# Patient Record
Sex: Male | Born: 1980 | Race: White | Hispanic: No | Marital: Single | Smoking: Former smoker
Health system: Southern US, Community
[De-identification: ages and names within clinical notes are randomized; demographics above are authoritative.]

## PROBLEM LIST (undated history)

## (undated) DIAGNOSIS — F419 Anxiety disorder, unspecified: Secondary | ICD-10-CM

## (undated) HISTORY — PX: TONSILLECTOMY: SUR1361

## (undated) HISTORY — PX: APPENDECTOMY: SHX54

## (undated) HISTORY — PX: FRACTURE SURGERY: SHX138

---

## 2003-12-15 ENCOUNTER — Other Ambulatory Visit: Payer: Self-pay

## 2004-06-06 ENCOUNTER — Emergency Department: Payer: Self-pay | Admitting: Emergency Medicine

## 2004-09-25 ENCOUNTER — Emergency Department: Payer: Self-pay | Admitting: Internal Medicine

## 2005-07-23 ENCOUNTER — Emergency Department: Payer: Self-pay | Admitting: Emergency Medicine

## 2005-09-06 ENCOUNTER — Emergency Department: Payer: Self-pay | Admitting: Emergency Medicine

## 2006-04-29 ENCOUNTER — Emergency Department: Payer: Self-pay | Admitting: Emergency Medicine

## 2007-08-04 ENCOUNTER — Emergency Department: Payer: Self-pay | Admitting: Emergency Medicine

## 2008-03-01 ENCOUNTER — Inpatient Hospital Stay: Payer: Self-pay | Admitting: Specialist

## 2008-11-25 ENCOUNTER — Emergency Department: Payer: Self-pay | Admitting: Emergency Medicine

## 2009-04-05 ENCOUNTER — Emergency Department: Payer: Self-pay | Admitting: Emergency Medicine

## 2009-12-28 ENCOUNTER — Emergency Department: Payer: Self-pay | Admitting: Emergency Medicine

## 2010-04-19 ENCOUNTER — Ambulatory Visit: Payer: Self-pay | Admitting: Internal Medicine

## 2010-04-25 ENCOUNTER — Emergency Department: Payer: Self-pay | Admitting: Unknown Physician Specialty

## 2011-05-02 ENCOUNTER — Emergency Department: Payer: Self-pay | Admitting: Unknown Physician Specialty

## 2011-11-30 ENCOUNTER — Emergency Department: Payer: Self-pay | Admitting: *Deleted

## 2012-04-09 ENCOUNTER — Emergency Department: Payer: Self-pay | Admitting: *Deleted

## 2012-11-19 ENCOUNTER — Emergency Department: Payer: Self-pay | Admitting: Emergency Medicine

## 2013-04-10 IMAGING — CR DG TIBIA/FIBULA 2V*L*
1 series · 2 of 2 positions shown · non-contrast
Comparison: None

REASON FOR EXAM: swelling, bruising
COMMENTS:

PROCEDURE:     DXR - DXR TIBIA AND FIBULA LT (LOWER L  - April 09, 2012  [DATE]
RESULT:     History: Pain

[Series 1: x tib-fib ap left · 0.14mm/px · 2 of 2 slices shown]
[im 1/2]
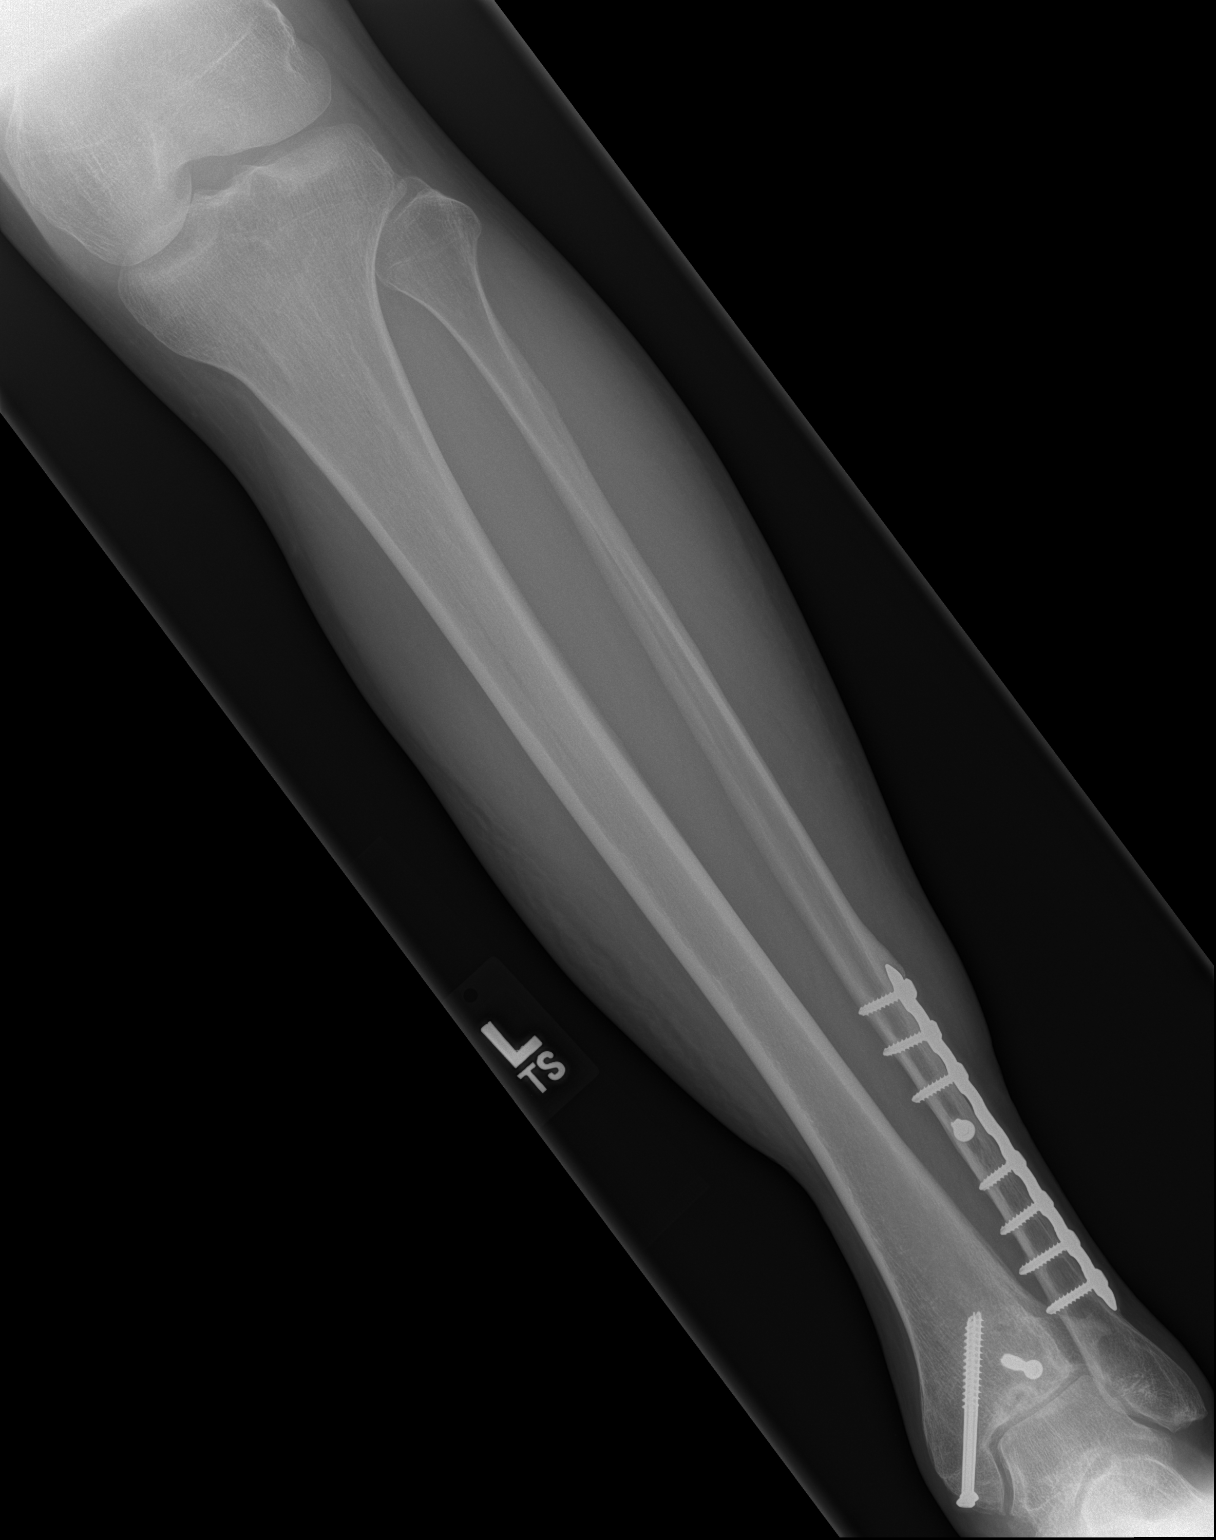
[im 2/2]
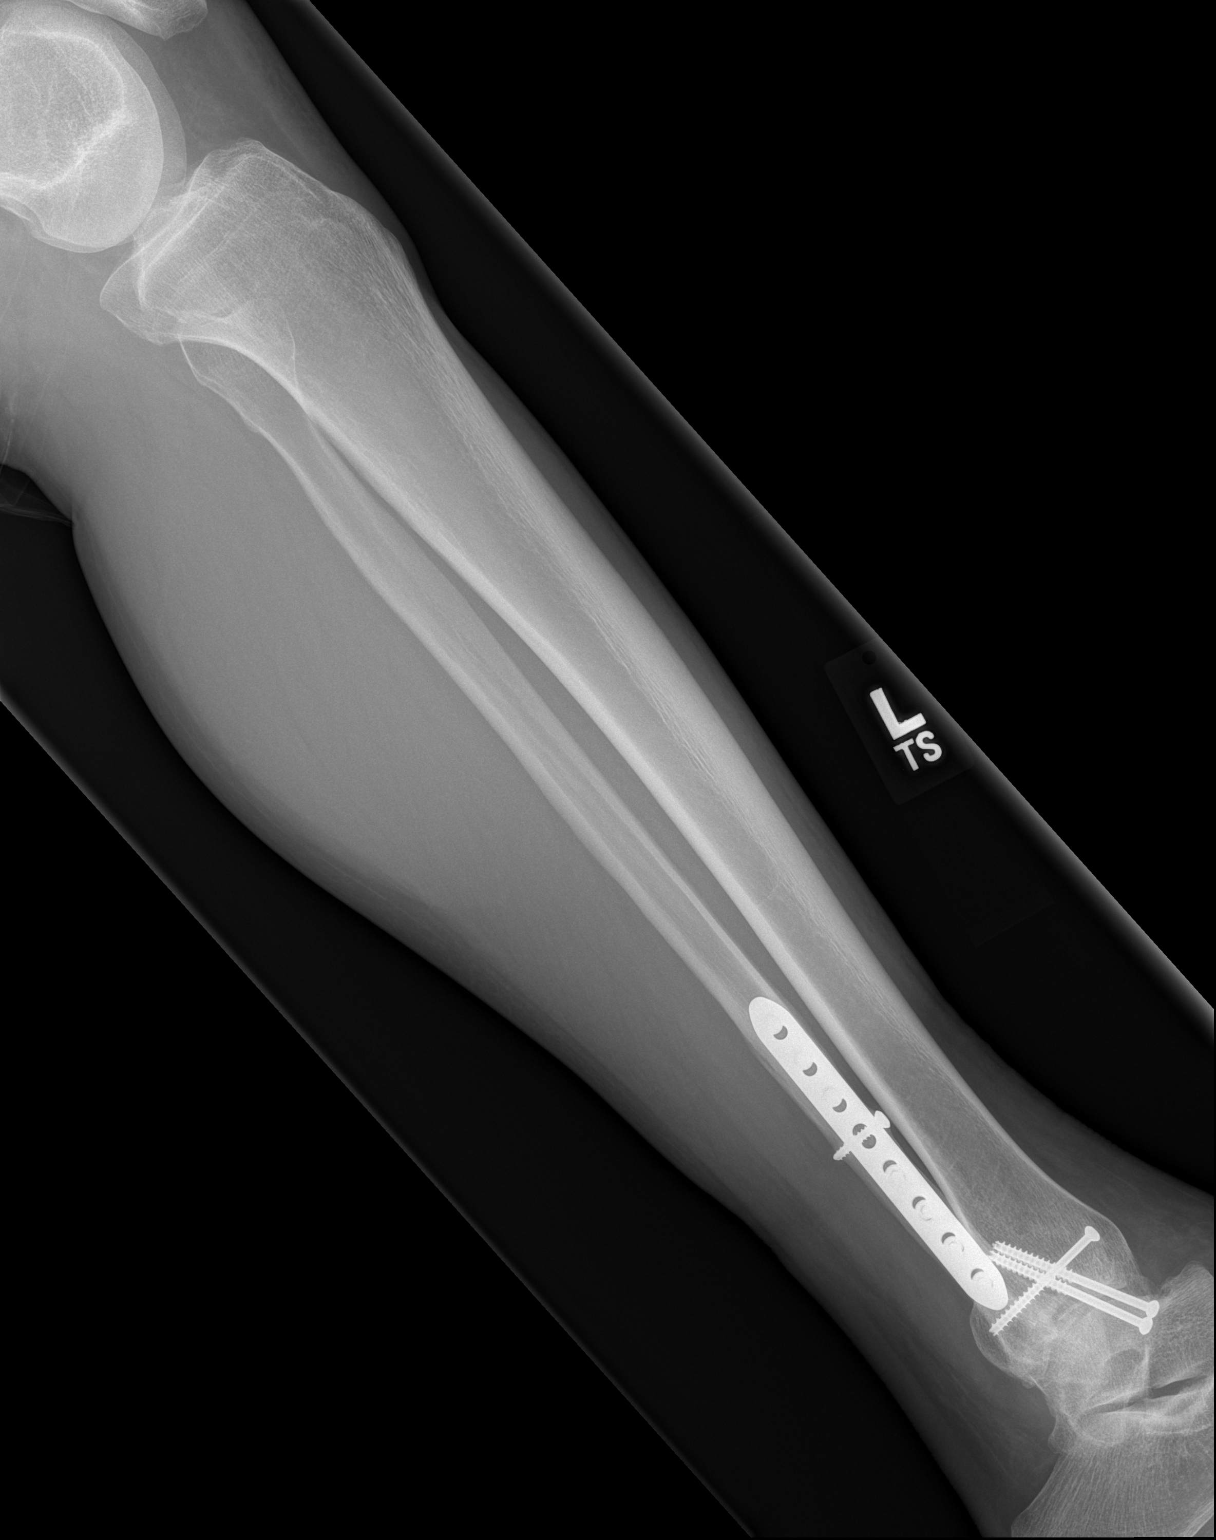

[2 of 2 positions shown; findings below may reference images not displayed]

FINDINGS: AP and lateral views of the  left  tibia and fibula demonstrates no acute
fracture or dislocation. There is orthopedic hardware transfixing a healed
distal fibular fracture and medial malleolus fracture. There is cystic
change involving the articular surface of the tibial plafond concerning for
osteochondral lesion. The soft tissues are unremarkable.
IMPRESSION: Please see above.

[REDACTED]

## 2014-06-17 ENCOUNTER — Emergency Department: Payer: Self-pay | Admitting: Emergency Medicine

## 2014-06-17 LAB — CBC WITH DIFFERENTIAL/PLATELET
Basophil #: 0 10*3/uL (ref 0.0–0.1)
Basophil %: 0.3 %
EOS ABS: 0.2 10*3/uL (ref 0.0–0.7)
EOS PCT: 1.3 %
HCT: 46.2 % (ref 40.0–52.0)
HGB: 15.4 g/dL (ref 13.0–18.0)
Lymphocyte #: 4.1 10*3/uL — ABNORMAL HIGH (ref 1.0–3.6)
Lymphocyte %: 31.4 %
MCH: 32.2 pg (ref 26.0–34.0)
MCHC: 33.4 g/dL (ref 32.0–36.0)
MCV: 96 fL (ref 80–100)
MONO ABS: 1.1 x10 3/mm — AB (ref 0.2–1.0)
Monocyte %: 8.6 %
Neutrophil #: 7.5 10*3/uL — ABNORMAL HIGH (ref 1.4–6.5)
Neutrophil %: 58.4 %
PLATELETS: 362 10*3/uL (ref 150–440)
RBC: 4.79 10*6/uL (ref 4.40–5.90)
RDW: 13.4 % (ref 11.5–14.5)
WBC: 12.9 10*3/uL — AB (ref 3.8–10.6)

## 2014-06-17 LAB — BASIC METABOLIC PANEL
Anion Gap: 12 (ref 7–16)
BUN: 11 mg/dL (ref 7–18)
CO2: 22 mmol/L (ref 21–32)
Calcium, Total: 9 mg/dL (ref 8.5–10.1)
Chloride: 104 mmol/L (ref 98–107)
Creatinine: 1 mg/dL (ref 0.60–1.30)
Glucose: 122 mg/dL — ABNORMAL HIGH (ref 65–99)
Osmolality: 276 (ref 275–301)
Potassium: 3.7 mmol/L (ref 3.5–5.1)
Sodium: 138 mmol/L (ref 136–145)

## 2014-11-25 ENCOUNTER — Emergency Department
Admission: EM | Admit: 2014-11-25 | Discharge: 2014-11-25 | Disposition: A | Discharge status: Home / Self Care | Payer: Self-pay | Attending: Emergency Medicine | Admitting: Emergency Medicine

## 2014-11-25 DIAGNOSIS — T63441A Toxic effect of venom of bees, accidental (unintentional), initial encounter: Secondary | ICD-10-CM | POA: Insufficient documentation

## 2014-11-25 DIAGNOSIS — Z720 Tobacco use: Secondary | ICD-10-CM | POA: Insufficient documentation

## 2014-11-25 DIAGNOSIS — Y9289 Other specified places as the place of occurrence of the external cause: Secondary | ICD-10-CM | POA: Insufficient documentation

## 2014-11-25 DIAGNOSIS — Y9389 Activity, other specified: Secondary | ICD-10-CM | POA: Insufficient documentation

## 2014-11-25 DIAGNOSIS — Y998 Other external cause status: Secondary | ICD-10-CM | POA: Insufficient documentation

## 2014-11-25 MED ORDER — DIPHENHYDRAMINE HCL 50 MG/ML IJ SOLN
INTRAMUSCULAR | Status: DC
Start: 2014-11-25 — End: 2014-11-25
  Filled 2014-11-25: qty 1

## 2014-11-25 MED ORDER — DIPHENHYDRAMINE HCL 50 MG/ML IJ SOLN
25.0000 mg | Freq: Once | INTRAMUSCULAR | Status: AC
  Administered 2014-11-25: 25 mg via INTRAVENOUS

## 2014-11-25 MED ORDER — EPINEPHRINE 0.3 MG/0.3ML IJ SOAJ: 0.3000 mg | Freq: Once | INTRAMUSCULAR | Status: DC

## 2014-11-25 MED ORDER — METHYLPREDNISOLONE SODIUM SUCC 125 MG IJ SOLR
125.0000 mg | Freq: Once | INTRAMUSCULAR | Status: AC
  Administered 2014-11-25: 125 mg via INTRAVENOUS

## 2014-11-25 MED ORDER — METHYLPREDNISOLONE SODIUM SUCC 125 MG IJ SOLR
INTRAMUSCULAR | Status: AC
  Filled 2014-11-25: qty 2

## 2014-11-25 NOTE — ED Provider Notes (Signed)
[facility] Emergency Department Provider Note     Time seen: ----------------------------------------- 11:12 AM on 11/25/2014 -----------------------------------------    I have reviewed the triage vital signs and the nursing notes.   HISTORY  Chief Complaint Allergic Reaction    HPI Jesus RibasJoshua L Nisley is a 34 y.o. male who presents ER after he was stung by a bee approximate 1520 prior to arrival. Patient states himself home EpiPen in the left thigh. Patient was really concerned due to prior severe allergic reaction for which she received epinephrine by EMS. This was with a wasp sting to the back is had. Denies any difficulty breathing or swallowing, denies shortness of breath or rashes this time.     History reviewed. No pertinent past medical history.  There are no active problems to display for this patient.   Past Surgical History  Procedure Laterality Date  . Tonsillectomy      No current outpatient prescriptions on file.  Allergies Bee venom  No family history on file.  Social History History  Substance Use Topics  . Smoking status: Current Every Day Smoker -- 1.00 packs/day    Types: Cigarettes  . Smokeless tobacco: Not on file  . Alcohol Use: Yes    Review of Systems Constitutional: Negative for fever. Eyes: Negative for visual changes. ENT: Negative for sore throat. Cardiovascular: Negative for chest pain. Respiratory: Negative for shortness of breath. Gastrointestinal: Negative for abdominal pain, vomiting and diarrhea. Genitourinary: Negative for dysuria. Musculoskeletal: Negative for back pain. Skin: Negative for rash. Neurological: Negative for headaches, focal weakness or numbness.  10-point ROS otherwise negative.  ____________________________________________   PHYSICAL EXAM:  VITAL SIGNS: ED Triage Vitals  Enc Vitals Group     BP 11/25/14 1036 144/84 mmHg     Pulse Rate 11/25/14 1036 100     Resp 11/25/14  1036 18     Temp 11/25/14 1036 98.2 F (36.8 C)     Temp Source 11/25/14 1036 Oral     SpO2 11/25/14 1036 97 %     Weight 11/25/14 1036 185 lb (83.915 kg)     Height 11/25/14 1036 5\' 11"  (1.803 m)     Head Cir --      Peak Flow --      Pain Score --      Pain Loc --      Pain Edu? --      Excl. in GC? --     Constitutional: Alert and oriented. Well appearing and in no distress. Eyes: Conjunctivae are normal. PERRL. Normal extraocular movements. ENT   Head: Normocephalic and atraumatic.   Nose: No congestion/rhinnorhea.   Mouth/Throat: Mucous membranes are moist.   Neck: No stridor. Hematological/Lymphatic/Immunilogical: No cervical lymphadenopathy. Cardiovascular: Rapid rate, regular rhythm. Normal and symmetric distal pulses are present in all extremities. No murmurs, rubs, or gallops. Respiratory: Normal respiratory effort without tachypnea nor retractions. Breath sounds are clear and equal bilaterally. No wheezes/rales/rhonchi. Gastrointestinal: Soft and nontender. No distention. No abdominal bruits. There is no CVA tenderness. Musculoskeletal: Nontender with normal range of motion in all extremities. No joint effusions.  No lower extremity tenderness nor edema. Neurologic:  Normal speech and language. No gross focal neurologic deficits are appreciated. Speech is normal. No gait instability. Skin:  Skin is warm, dry and intact. No rash noted. No hives are noted Psychiatric: Mood and affect are normal. Speech and behavior are normal. Patient exhibits appropriate insight and judgment.  ____________________________________________   ___________________________________________  ED COURSE:  Pertinent  labs & imaging results that were available during my care of the patient were reviewed by me and considered in my medical decision making (see chart for details). Patient will be observed after receiving Benadryl and Solu-Medrol here, he is in no acute distress this time  will likely be discharged shortly  ____________________________________________   RADIOLOGY  None  ____________________________________________    FINAL ASSESSMENT AND PLAN  Bee sting, allergic reaction  Plan: Patient is in no acute distress, has not had any further sequela from either bee sting or epinephrine injection. Stable for outpatient follow-up.    Emily Filbert, MD   Emily Filbert, MD 11/25/14 1226

## 2014-11-25 NOTE — Discharge Instructions (Signed)

## 2014-11-25 NOTE — ED Notes (Signed)
Pt states he was stung on the left FA about 15-6020min pTA, states he is allergic to them and used his epi pen.the patient is not having any difficulty at present.

## 2015-04-03 ENCOUNTER — Encounter: Payer: Self-pay | Admitting: Emergency Medicine

## 2015-04-03 ENCOUNTER — Ambulatory Visit
Admission: EM | Admit: 2015-04-03 | Discharge: 2015-04-03 | Disposition: A | Discharge status: Home / Self Care | Payer: BLUE CROSS/BLUE SHIELD | Attending: Family Medicine | Admitting: Family Medicine

## 2015-04-03 DIAGNOSIS — Z202 Contact with and (suspected) exposure to infections with a predominantly sexual mode of transmission: Secondary | ICD-10-CM

## 2015-04-03 LAB — CHLAMYDIA/NGC RT PCR (ARMC ONLY)
CHLAMYDIA TR: NOT DETECTED
N GONORRHOEAE: NOT DETECTED

## 2015-04-03 MED ORDER — AZITHROMYCIN 500 MG PO TABS
1000.0000 mg | ORAL_TABLET | Freq: Once | ORAL | Status: AC
  Administered 2015-04-03: 1000 mg via ORAL

## 2015-04-03 MED ORDER — CEFTRIAXONE SODIUM 250 MG IJ SOLR
250.0000 mg | Freq: Once | INTRAMUSCULAR | Status: AC
  Administered 2015-04-03: 250 mg via INTRAMUSCULAR

## 2015-04-03 MED ORDER — AZITHROMYCIN 500 MG PO TABS: 1000.0000 mg | ORAL_TABLET | Freq: Once | ORAL | Status: DC

## 2015-04-03 NOTE — ED Notes (Signed)
Girlfriend was diagnosed with chlamydia. Pt denies symptoms. Pt comes to be treated.

## 2015-04-03 NOTE — Discharge Instructions (Signed)
Chlamydia, Male Chlamydia is an infection. It is spread through sexual contact. Chlamydia can be in different areas of the body. These areas include the urethra, throat, or rectum. It is important to treat chlamydia as soon as possible. It can damage other organs.  CAUSES  Chlamydia is caused by bacteria. It is a sexually transmitted disease. This means that it is passed from an infected partner during intimate contact. This contact could be with the genitals, mouth, or rectal area.  SIGNS AND SYMPTOMS  There may not be any symptoms. This is often the case early in the infection. If there are symptoms, they are usually mild and may only be noticeable in the morning. Symptoms you may notice include:   Burning with urination.  Pain or swelling in the testicles.  Watery mucus-like discharge from the penis.  Long-standing (chronic) pelvic pain after frequent infections.  Pain, swelling, or itching around the anus.  A sore throat.  Itching, burning, or redness in the eyes, or discharge from the eyes. DIAGNOSIS  To diagnose this infection, your health care provider will do a pelvic exam. A sample of urine or a swab from the rectum may be taken for testing.  TREATMENT  Chlamydia is treated with antibiotic medicines. Your health care provider may test you for infection again 3 months after treatment. HOME CARE INSTRUCTIONS  Take your antibiotic medicine as directed by your health care provider. Finish the antibiotic even if you start to feel better. Incomplete treatment will put you at risk for not being able to have children (sterility).   Take medicines only as directed by your health care provider.   Rest.   Inform any sexual partners about your infection. Even if they are symptom free or have a negative culture or evaluation, they should be treated for the condition.   Do not have sex (intercourse) until treatment is completed and your health care provider says it is okay.   Keep  all follow-up visits as directed by your health care provider.   Not all test results are available during your visit. If your test results are not back during the visit, make an appointment with your health care provider to find out the results. Do not assume everything is normal if you have not heard from your health care provider or the medical facility. It is your responsibility to get your test results. SEEK MEDICAL CARE IF:  You develop new joint pain.  You have a fever. SEEK IMMEDIATE MEDICAL CARE IF:   Your pain increases.   You have abnormal discharge.   You have pain during intercourse. MAKE SURE YOU:   Understand these instructions.  Will watch your condition.  Will get help right away if you are not doing well or get worse.   This information is not intended to replace advice given to you by your health care provider. Make sure you discuss any questions you have with your health care provider.   Document Released: 06/12/2005 Document Revised: 07/03/2014 Document Reviewed: 12/19/2012 Elsevier Interactive Patient Education 2016 [employer] Chlamydia Test WHY AM I HAVING THIS TEST? This a test to see if you have chlamydia. Chlamydia is a common sexually transmitted disease (STD). Your health care provider may perform this test if you:  Are sexually active.  Have another STD.  Have complaints about pelvic pain, vaginal discharge, or both. WHAT KIND OF SAMPLE IS TAKEN? Depending on your symptoms, your health care provider may collect any one of the following samples:  A blood sample. This is usually collected by inserting a needle into a vein.  A tissue sample. This is collected by swabbing tissue of the eye, urethra, or cervix.  A sample of sputum. This is collected by having you cough into a sterile container that is provided by the lab. HOW DO I PREPARE FOR THE TEST? There is no preparation required for this test. HOW ARE THE TEST RESULTS REPORTED? Your  test results will be reported as either positive or negative. It is your responsibility to obtain your test results. Ask the lab or department performing the test when and how you will get your results. WHAT DO THE RESULTS MEAN? A positive result means that you have a chlamydia infection. Talk with your health care provider to discuss your results, treatment options, and if necessary, the need for more tests. Talk with your health care provider if you have any questions about your results.   This information is not intended to replace advice given to you by your health care provider. Make sure you discuss any questions you have with your health care provider.   Document Released: 07/05/2004 Document Revised: 07/03/2014 Document Reviewed: 11/05/2013 Elsevier Interactive Patient Education [employer]

## 2015-04-04 NOTE — ED Provider Notes (Addendum)
CSN: [id number]     Arrival date & time 04/03/15  1346 History   First MD Initiated Contact with Patient 04/03/15 1406     Chief Complaint  Patient presents with  . Exposure to STD   (Consider location/radiation/quality/duration/timing/severity/associated sxs/prior Treatment) HPI Comments: Single caucasian male here for chlamydia treatment as girlfriend was told she had + test this week and started treatment today.  PSHx appendectomy, tonsillectomy  PMHx anaphylactic reaction bee stings ambulance to ER, smoker and alcohol intake +  Prior history of positive STD a couple years ago.  Denied symptoms at this time.  Patient is a 34 y.o. male presenting with STD exposure. The history is provided by the patient.  Exposure to STD This is a recurrent problem. The current episode started yesterday. The problem has not changed since onset.Pertinent negatives include no chest pain, no abdominal pain, no headaches and no shortness of breath. Nothing aggravates the symptoms. He has tried nothing for the symptoms.    History reviewed. No pertinent past medical history. Past Surgical History  Procedure Laterality Date  . Tonsillectomy    . Appendectomy     History reviewed. No pertinent family history. Social History  Substance Use Topics  . Smoking status: Current Every Day Smoker -- 1.00 packs/day    Types: Cigarettes  . Smokeless tobacco: None  . Alcohol Use: Yes    Review of Systems  Constitutional: Negative for fever, chills, diaphoresis, activity change, appetite change, fatigue and unexpected weight change.  HENT: Negative for congestion, dental problem, drooling, ear discharge, ear pain, facial swelling, hearing loss, mouth sores, nosebleeds, postnasal drip, rhinorrhea, sinus pressure, sneezing, sore throat, tinnitus, trouble swallowing and voice change.   Eyes: Negative for photophobia, pain, discharge, redness, itching and visual disturbance.  Respiratory: Negative for cough, choking,  chest tightness, shortness of breath, wheezing and stridor.   Cardiovascular: Negative for chest pain and leg swelling.  Gastrointestinal: Negative for nausea, vomiting, abdominal pain, diarrhea, constipation, blood in stool and abdominal distention.  Endocrine: Negative for cold intolerance and heat intolerance.  Genitourinary: Negative for dysuria, urgency, frequency, hematuria, flank pain, decreased urine volume, discharge, penile swelling, scrotal swelling, enuresis, genital sores, penile pain and testicular pain.  Musculoskeletal: Negative for myalgias, back pain, joint swelling, arthralgias, gait problem, neck pain and neck stiffness.  Skin: Negative for color change, pallor, rash and wound.  Allergic/Immunologic: Positive for environmental allergies. Negative for food allergies.  Neurological: Negative for dizziness, tremors, seizures, syncope, facial asymmetry, speech difficulty, weakness, light-headedness, numbness and headaches.  Hematological: Negative for adenopathy. Does not bruise/bleed easily.  Psychiatric/Behavioral: Negative for behavioral problems, confusion, sleep disturbance and agitation.    Allergies  Bee venom  Home Medications   Prior to Admission medications   Medication Sig Start Date End Date Taking? Authorizing Provider  EPINEPHrine (EPIPEN 2-PAK) 0.3 mg/0.3 mL IJ SOAJ injection Inject 0.3 mLs (0.3 mg total) into the muscle once. 11/25/14   Emily Filbert, MD  ibuprofen (ADVIL,MOTRIN) 200 MG tablet Take 600-800 mg by mouth every 6 (six) hours as needed.    Historical Provider, MD   Meds Ordered and Administered this Visit   Medications  cefTRIAXone (ROCEPHIN) injection 250 mg (250 mg Intramuscular Given 04/03/15 1430)  azithromycin (ZITHROMAX) tablet 1,000 mg (1,000 mg Oral Given 04/03/15 1431)    BP 133/74 mmHg  Pulse 64  Temp(Src) 98.3 F (36.8 C) (Oral)  Resp 16  Ht  (1.803 m)  Wt 185 lb (83.915 kg)  BMI 25.81 kg/m2  SpO2 100% No data  found.   Physical Exam  Constitutional: He is oriented to person, place, and time. Vital signs are normal. He appears well-developed and well-nourished. No distress.  HENT:  Head: Normocephalic and atraumatic.  Right Ear: External ear normal.  Left Ear: External ear normal.  Nose: Nose normal.  Mouth/Throat: Oropharynx is clear and moist. No oropharyngeal exudate.  Eyes: Conjunctivae, EOM and lids are normal. Pupils are equal, round, and reactive to light. Right eye exhibits no discharge. Left eye exhibits no discharge. No scleral icterus.  Neck: Trachea normal and normal range of motion. Neck supple. No tracheal deviation present.  Cardiovascular: Normal rate, regular rhythm, normal heart sounds and intact distal pulses.   Pulmonary/Chest: Effort normal and breath sounds normal. No stridor. No respiratory distress. He has no wheezes. He has no rales.  Abdominal: Soft. He exhibits no distension.  Musculoskeletal: Normal range of motion. He exhibits no edema.  Neurological: He is alert and oriented to person, place, and time. He exhibits normal muscle tone. Coordination normal.  Skin: Skin is warm, dry and intact. No rash noted. He is not diaphoretic. No erythema. No pallor.  Psychiatric: He has a normal mood and affect. His speech is normal and behavior is normal. Judgment and thought content normal. Cognition and memory are normal.  Nursing note and vitals reviewed.   ED Course  Procedures (including critical care time)  Labs Review Labs Reviewed  CHLAMYDIA/NGC RT PCR [facility] ONLY)    Imaging Review No results found.   RN Lia Foyer administered rocephin  IM x 1 and zithromax  po x 1.  Repeat VSS.  Patient discharged ambulatory after 15 minute observation will call with test results once available.  Patient verbalized understanding of information/instructions, agreed with plan of care and had no further questions at this time.   MDM   1. Exposure to  chlamydia    did not use condom consistently with girlfriend she was notified she had chlamydia and started treatment today.  Patient unsure what other testing she had done.  Discussed if one test positive typically another is positive.  Patient only wanted gonorrhea and chlamydia testing completed. Notified patients results will be available in 3-4 days will call for results.  Treating for chlamydia and gonorrhea today per patient preference and known contact this week with chlamydia positive partner.  RTC if new symptoms.  Encouraged patient to use condoms for all sexual encounters since he is not in a monogamous relationship.  Discussed not all sexually transmitted diseases are curable e.g. herpes, HPV, HIV and that antibiotic resistant gonorrhea and syphilis in [place]. [facility] handout on gonorrhea and chlamydia testing and chlamydia information given to patient.  Patient verbalized understanding and agreed with plan of care and had no further questions at this time.      Barbaraann Barthel, NP 04/04/15 1108  15 Apr 2015 at 0825 Attempted to notify patient via telephone lab tests normal/negative.  Letter sent to address on file as no answer and voicemail unavailable.  GC negative.  Barbaraann Barthel, NP 04/15/15 [phone]

## 2015-04-05 NOTE — ED Notes (Signed)
Final report STD labs negative

## 2017-03-20 ENCOUNTER — Ambulatory Visit
Admission: EM | Admit: 2017-03-20 | Discharge: 2017-03-20 | Disposition: A | Discharge status: Home / Self Care | Payer: 59 | Attending: Emergency Medicine | Admitting: Emergency Medicine

## 2017-03-20 ENCOUNTER — Encounter: Payer: Self-pay | Admitting: Emergency Medicine

## 2017-03-20 ENCOUNTER — Ambulatory Visit (INDEPENDENT_AMBULATORY_CARE_PROVIDER_SITE_OTHER): Payer: 59

## 2017-03-20 DIAGNOSIS — R062 Wheezing: Secondary | ICD-10-CM | POA: Diagnosis not present

## 2017-03-20 DIAGNOSIS — R6889 Other general symptoms and signs: Secondary | ICD-10-CM

## 2017-03-20 DIAGNOSIS — J069 Acute upper respiratory infection, unspecified: Secondary | ICD-10-CM

## 2017-03-20 DIAGNOSIS — R05 Cough: Secondary | ICD-10-CM

## 2017-03-20 LAB — RAPID INFLUENZA A&B ANTIGENS (ARMC ONLY)
INFLUENZA A (ARMC): NEGATIVE
INFLUENZA B (ARMC): NEGATIVE

## 2017-03-20 LAB — RAPID STREP SCREEN (MED CTR MEBANE ONLY): STREPTOCOCCUS, GROUP A SCREEN (DIRECT): NEGATIVE

## 2017-03-20 MED ORDER — AEROCHAMBER PLUS MISC: 2 refills | Status: DC

## 2017-03-20 MED ORDER — IBUPROFEN 600 MG PO TABS: 600.0000 mg | ORAL_TABLET | Freq: Four times a day (QID) | ORAL | 0 refills | Status: DC | PRN

## 2017-03-20 MED ORDER — ALBUTEROL SULFATE HFA 108 (90 BASE) MCG/ACT IN AERS: 1.0000 | INHALATION_SPRAY | Freq: Four times a day (QID) | RESPIRATORY_TRACT | 0 refills | Status: DC | PRN

## 2017-03-20 MED ORDER — PREDNISONE 20 MG PO TABS: 40.0000 mg | ORAL_TABLET | Freq: Every day | ORAL | 0 refills | Status: AC

## 2017-03-20 MED ORDER — IPRATROPIUM-ALBUTEROL 0.5-2.5 (3) MG/3ML IN SOLN
3.0000 mL | Freq: Once | RESPIRATORY_TRACT | Status: AC
  Administered 2017-03-20: 3 mL via RESPIRATORY_TRACT

## 2017-03-20 MED ORDER — IPRATROPIUM BROMIDE 0.06 % NA SOLN: 2.0000 | Freq: Four times a day (QID) | NASAL | 0 refills | Status: DC

## 2017-03-20 NOTE — ED Triage Notes (Signed)
Patient here today c/o generalized body aches, headache, sore throat x 2 days. Patient has felt feverish, but did not take temperature. Patient has tried OTC Catering manager, Dayquil, Nyquil with a little bit of relief.

## 2017-03-20 NOTE — ED Provider Notes (Signed)
HPI  SUBJECTIVE:  Jesus Nash is a 36 y.o. male who presents with body aches, diffuse headache, feverish, nasal congestion, rhinorrhea, left ear pain, sore throat, cough productive of whitish phlegm, wheezing, shortness of breath starting yesterday. No documented fevers at home. He has tried Alka-Seltzer, NyQuil, DayQuil with some improvement in symptoms. No aggravating factors. Patient denies cervical lymphadenopathy, sensation of his throat swelling shut, difficulty breathing, drooling, trismus, muffled hot potato voice. No sinus pain or pressure, change in hearing, otorrhea. No postnasal drip. No chest pain. Denies neck stiffness, rash, abdominal pain. The cough is not keeping him up at night. No antibiotics in the past month. No allergy type symptoms . No tick bite. He took Alka-Seltzer within 6-8 hours of evaluation. He has a 25-pack-year history of smoking, L otitis media, left TM perforation, status post tonsillectomy. No history of asthma, emphysema, COPD, diabetes, hypertension. PMD: None.  History reviewed. No pertinent past medical history.  Past Surgical History:  Procedure Laterality Date  . APPENDECTOMY    . TONSILLECTOMY      Family History  Problem Relation Age of Onset  . Diabetes Mother     Social History  Substance Use Topics  . Smoking status: Current Every Day Smoker    Packs/day: 1.00    Types: Cigarettes  . Smokeless tobacco: Never Used  . Alcohol use Yes    No current facility-administered medications for this encounter.   Current Outpatient Prescriptions:  .  albuterol (PROVENTIL HFA;VENTOLIN HFA) 108 (90 Base) MCG/ACT inhaler, Inhale 1-2 puffs into the lungs every 6 (six) hours as needed for wheezing or shortness of breath., Disp: 1 Inhaler, Rfl: 0 .  EPINEPHrine (EPIPEN 2-PAK) 0.3 mg/0.3 mL IJ SOAJ injection, Inject 0.3 mLs (0.3 mg total) into the muscle once., Disp: 1 Device, Rfl: 1 .  ibuprofen (ADVIL,MOTRIN) 600 MG tablet, Take 1 tablet (600 mg total)  by mouth every 6 (six) hours as needed., Disp: 30 tablet, Rfl: 0 .  ipratropium (ATROVENT) 0.06 % nasal spray, Place 2 sprays into both nostrils 4 (four) times daily. 3-4 times/ day, Disp: 15 mL, Rfl: 0 .  predniSONE (DELTASONE) 20 MG tablet, Take 2 tablets (40 mg total) by mouth daily with breakfast., Disp: 10 tablet, Rfl: 0 .  Spacer/Aero-Holding Chambers (AEROCHAMBER PLUS) inhaler, Use as instructed, Disp: 1 each, Rfl: 2  Allergies  Allergen Reactions  . Bee Venom Anaphylaxis and Hives     ROS  As noted in HPI.   Physical Exam  BP 128/69 (BP Location: Left Arm)   Pulse 74   Temp 98.2 F (36.8 C) (Oral)   Resp 16   Ht  (1.803 m)   Wt 185 lb (83.9 kg)   SpO2 98%   BMI 25.80 kg/m   Constitutional: Well developed, well nourished, no acute distress Eyes:  EOMI, conjunctiva normal bilaterally HENT: Normocephalic, atraumatic,mucus membranes moist . Left TM with scarring. It is not dull or bulging. No effusion.. Right TM normal. Positive extensive watery purulent nasal drainage and erythematous, swollen turbinates. No sinus tenderness. Tonsils surgically absent. Uvula midline. Difficulty visualizing oropharynx. Neck: No cervical lymphadenopathy, meningismus Respiratory: Normal inspiratory effort, fair air movement, loud expiratory wheezing throughout and prolonged expiratory phase  Cardiovascular: Normal rate regular rhythm no murmurs rubs or gallops  GI: nondistended soft, nontender, active bowel sounds. No guarding, rebound.  Back: No CVA tenderness skin: No rash, skin intact Musculoskeletal: no deformities Neurologic: Alert & oriented x 3, no focal neuro deficits Psychiatric: Speech and behavior  appropriate   ED Course   Medications  ipratropium-albuterol (DUONEB) 0.5-2.5 (3) MG/3ML nebulizer solution 3 mL (3 mLs Nebulization Given 03/20/17 1442)    Orders Placed This Encounter  Procedures  . Rapid strep screen    Standing Status:   Standing    Number of  Occurrences:   1  . Rapid Influenza A&B Antigens ([facility] only)    Standing Status:   Standing    Number of Occurrences:   1  . Culture, group A strep    Standing Status:   Standing    Number of Occurrences:   1  . DG Chest 2 View    Standing Status:   Standing    Number of Occurrences:   1    Order Specific Question:   Reason for Exam (SYMPTOM  OR DIAGNOSIS REQUIRED)    Answer:   BA cough wheezing throughout r/o pna ptx effusion pulm edema  . Droplet precaution    Standing Status:   Standing    Number of Occurrences:   1    Results for orders placed or performed during the hospital encounter of 03/20/17 (from the past 24 hour(s))  Rapid strep screen     Status: None   Collection Time: 03/20/17  2:27 PM  Result Value Ref Range   Streptococcus, Group A Screen (Direct) NEGATIVE NEGATIVE  Rapid Influenza A&B Antigens ([facility] only)     Status: None   Collection Time: 03/20/17  2:27 PM  Result Value Ref Range   Influenza A ([facility]) NEGATIVE NEGATIVE   Influenza B ([facility]) NEGATIVE NEGATIVE   Dg Chest 2 View  Result Date: 03/20/2017 CLINICAL DATA:  Cough and wheezing. EXAM: CHEST  2 VIEW COMPARISON:  April 25, 2010 FINDINGS: The heart size and mediastinal contours are within normal limits. Both lungs are clear. There is no pneumothorax. The visualized skeletal structures are unremarkable. IMPRESSION: No active cardiopulmonary disease.  No pneumothorax is noted. Electronically Signed   By: Sherian Rein M.D.   On: 03/20/2017 14:45    ED Clinical Impression  Upper respiratory tract infection, unspecified type  Flu-like symptoms   ED Assessment/Plan  We'll check a strep and flu. Giving DuoNeb and checking a chest x-ray to rule out pneumonia given the loud wheezing. We'll reevaluate.   Strep, flu negative. Throat culture sent. Imaging independently reviewed. No pneumonia. See radiology report for details.  Reevaluation, patient states that he feels significantly better. He has improved  air movement and less wheezing. No rales or rhonchi.  Presentation most consistent with a viral syndrome. He does not have any sinus tenderness, has no pneumonia on x-ray, no evidence of meningitis or otitis. Doubt intra-abdominal process or UTI as this is primarily respiratory. Plan to send home with an albuterol inhaler with a spacer, prednisone 40 mg for 5 days as I suspect the patient has an element of COPD given his duration of smoking, Atrovent nasal spray, Mucinex D, saline nasal irrigation with a Lloyd Huger med sinus rinse or neti pot, ibuprofen 600 mg 1 g and Tylenol 3-4 times a day as needed for fevers and body aches. Discontinue other cold medicines. We'll provide a primary care referral list or may return here if not better in a week to 10 days. He will go to the ER if he gets worse.   Discussed labs, imaging, MDM, plan and followup with patient. Discussed sn/sx that should prompt return to the ED. Patient  agrees with plan.   Meds ordered this encounter  Medications  . ipratropium-albuterol (DUONEB) 0.5-2.5 (3) MG/3ML nebulizer solution 3 mL  . albuterol (PROVENTIL HFA;VENTOLIN HFA) 108 (90 Base) MCG/ACT inhaler    Sig: Inhale 1-2 puffs into the lungs every 6 (six) hours as needed for wheezing or shortness of breath.    Dispense:  1 Inhaler    Refill:  0  . ipratropium (ATROVENT) 0.06 % nasal spray    Sig: Place 2 sprays into both nostrils 4 (four) times daily. 3-4 times/ day    Dispense:  15 mL    Refill:  0  . Spacer/Aero-Holding Chambers (AEROCHAMBER PLUS) inhaler    Sig: Use as instructed    Dispense:  1 each    Refill:  2  . ibuprofen (ADVIL,MOTRIN) 600 MG tablet    Sig: Take 1 tablet (600 mg total) by mouth every 6 (six) hours as needed.    Dispense:  30 tablet    Refill:  0  . predniSONE (DELTASONE) 20 MG tablet    Sig: Take 2 tablets (40 mg total) by mouth daily with breakfast.    Dispense:  10 tablet    Refill:  0    *This clinic note was created using Administrator, sports. Therefore, there may be occasional mistakes despite careful proofreading.  ?   Domenick Gong, MD 03/20/17 1537

## 2017-03-20 NOTE — Discharge Instructions (Signed)
1-2 puffs from your albuterol inhaler using  your spacer, prednisone 40 mg for 5 days, Atrovent nasal spray for the nasal congestion and postnasal drip, Mucinex D, saline nasal irrigation with a Lloyd Huger med sinus rinse or neti pot, ibuprofen 600 mg 1 g and Tylenol 3-4 times a day as needed for fevers and body aches. Stop all other cold medications.   Here is a list of primary care providers who are taking new patients:  Dr. Elizabeth Sauer, Dr. Schuyler Amor [address] [place] [address] [phone]  [facility] [address] [place] [address]  [phone]  [facility] [address] [place], [place] [address] [phone]  [facility] [address] [place]  [phone] [place], [place] [address]  Here are clinics/ other resources who will see you if you do not have insurance. Some have certain criteria that you must meet. Call them and find out what they are:  [facility]: [address], [place], [place] [address] Phone: [phone] Hours: First and Third Saturdays of each Month, 9 a.m. - 1 p.m.  [facility]: [address] [place], [place] [address] Phone: [phone] Hours: Tuesday, 4 p.m. - 8 p.m. Thursday, 1 p.m. - 8 p.m. Wednesday, 9 a.m. - [facility] [address] [place], [place] [address] Phone: [phone] Pharmacy Phone Number: [phone] Dental Phone Number: [phone] [facility] Insurance Help: [phone]  Dental Hours: Monday - Thursday, 8 a.m. - 6 p.m.  [facility] [address] [place], [place] [address] Phone: [phone] Pharmacy Phone Number: [phone] [facility] Insurance Help: [phone]  [facility] [address] [place] [place], [place] [address] Phone: [phone] Pharmacy Phone Number: [phone] [facility] Insurance Help: [phone]  [facility] [address] [place], [place]  [address] Phone: [phone] [facility] Insurance Help: [phone]   [facility] [address] [place], [place] [address] Phone: [phone]  Go to www.goodrx.com to look up your medications. This will give you a list of where you can find your prescriptions at the most affordable prices. Or ask the pharmacist what the cash price is, or if they have any other discount programs available to help make your medication more affordable. This can be less expensive than what you would pay with insurance.

## 2017-03-23 LAB — CULTURE, GROUP A STREP (THRC)

## 2018-01-10 ENCOUNTER — Ambulatory Visit
Admission: EM | Admit: 2018-01-10 | Discharge: 2018-01-10 | Disposition: A | Discharge status: Home / Self Care | Payer: 59 | Attending: Family Medicine | Admitting: Family Medicine

## 2018-01-10 DIAGNOSIS — H6692 Otitis media, unspecified, left ear: Secondary | ICD-10-CM

## 2018-01-10 DIAGNOSIS — R42 Dizziness and giddiness: Secondary | ICD-10-CM

## 2018-01-10 MED ORDER — MECLIZINE HCL 25 MG PO TABS: 25.0000 mg | ORAL_TABLET | Freq: Three times a day (TID) | ORAL | 0 refills | Status: DC | PRN

## 2018-01-10 MED ORDER — AMOXICILLIN-POT CLAVULANATE 875-125 MG PO TABS: 1.0000 | ORAL_TABLET | Freq: Two times a day (BID) | ORAL | 0 refills | Status: DC

## 2018-01-10 NOTE — ED Triage Notes (Signed)
As per patient dizzy onset today left ear pain had infection in past.

## 2018-01-10 NOTE — ED Provider Notes (Signed)
[facility]    CSN: [id number] Arrival date & time: 01/10/18  1340  History   Chief Complaint Chief Complaint  Patient presents with  . Dizziness   HPI  37 year old male presents with left ear discomfort and dizziness.  Patient states that it started this morning.  He states that his left ear has felt clogged.  He has a history of left ear problems.  He states that he has had prior perforation and infection.  Patient states that he has been dizzy today.  He has difficulty describing the dizziness but states that he has been off balance.  He is also had an episode of feeling flushed where he had to sit down.  No medications or interventions tried.  No fever.  No other associated symptoms.  No other complaints.  PMH: Tobacco abuse, Hx of TM perforation  Past Surgical History:  Procedure Laterality Date  . APPENDECTOMY    . TONSILLECTOMY         Home Medications    Prior to Admission medications   Medication Sig Start Date End Date Taking? Authorizing Provider  albuterol (PROVENTIL HFA;VENTOLIN HFA) 108 (90 Base) MCG/ACT inhaler Inhale 1-2 puffs into the lungs every 6 (six) hours as needed for wheezing or shortness of breath. 03/20/17  Yes Domenick GongMortenson, Ashley, MD  EPINEPHrine (EPIPEN 2-PAK) 0.3 mg/0.3 mL IJ SOAJ injection Inject 0.3 mLs (0.3 mg total) into the muscle once. 11/25/14  Yes Emily FilbertWilliams, Jonathan E, MD  ibuprofen (ADVIL,MOTRIN) 600 MG tablet Take 1 tablet (600 mg total) by mouth every 6 (six) hours as needed. 03/20/17  Yes Domenick GongMortenson, Ashley, MD  ipratropium (ATROVENT) 0.06 % nasal spray Place 2 sprays into both nostrils 4 (four) times daily. 3-4 times/ day 03/20/17  Yes Domenick GongMortenson, Ashley, MD  Spacer/Aero-Holding Chambers (AEROCHAMBER PLUS) inhaler Use as instructed 03/20/17  Yes Domenick GongMortenson, Ashley, MD  amoxicillin-clavulanate (AUGMENTIN) 875-125 MG tablet Take 1 tablet by mouth every 12 (twelve) hours. 01/10/18   Tommie Samsook, Myshawn Chir[place]oga G, DO  meclizine (ANTIVERT) 25 MG tablet Take  1 tablet (25 mg total) by mouth 3 (three) times daily as needed for dizziness. 01/10/18   Tommie Samsook, Detrell Umscheid G, DO    Family History Family History  Problem Relation Age of Onset  . Diabetes Mother     Social History Social History   Tobacco Use  . Smoking status: Current Every Day Smoker    Packs/day: 1.00    Types: Cigarettes  . Smokeless tobacco: Never Used  Substance Use Topics  . Alcohol use: Yes  . Drug use: No     Allergies   Bee venom   Review of Systems Review of Systems  Constitutional: Negative.   HENT: Positive for ear pain.   Neurological: Positive for dizziness.   Physical Exam Triage Vital Signs ED Triage Vitals  Enc Vitals Group     BP 01/10/18 1351 115/71     Pulse Rate 01/10/18 1351 76     Resp 01/10/18 1351 16     Temp 01/10/18 1351 98 F (36.7 C)     Temp Source 01/10/18 1351 Oral     SpO2 01/10/18 1351 96 %     Weight 01/10/18 1350 190 lb (86.2 kg)     Height 01/10/18 1350 5\' 11"  (1.803 m)     Head Circumference --      Peak Flow --      Pain Score 01/10/18 1350 0     Pain Loc --      Pain Edu? --  Excl. in [place]? --    Updated Vital Signs BP 115/71 (BP Location: Left Arm)   Pulse 76   Temp 98 F (36.7 C) (Oral)   Resp 16   Ht 5\' 11"  (1.803 m)   Wt 190 lb (86.2 kg)   SpO2 96%   BMI 26.50 kg/m   Visual Acuity Right Eye Distance:   Left Eye Distance:   Bilateral Distance:    Right Eye Near:   Left Eye Near:    Bilateral Near:     Physical Exam  Constitutional: He is oriented to person, place, and time. He appears well-developed. No distress.  HENT:  Head: Normocephalic and atraumatic.  Left TM with effusion.  Cardiovascular: Normal rate and regular rhythm.  Pulmonary/Chest: Effort normal and breath sounds normal. He has no wheezes. He has no rales.  Neurological: He is alert and oriented to person, place, and time.  HiNTs exam -head impulse test positive.  Horizontal nystagmus noted with leftward gaze. Negative test of  skew.  Psychiatric: He has a normal mood and affect. His behavior is normal.  Nursing note and vitals reviewed.    UC Treatments / Results  Labs (all labs ordered are listed, but only abnormal results are displayed) Labs Reviewed - No data to display  EKG None  Radiology No results found.  Procedures Procedures (including critical care time)  Medications Ordered in UC Medications - No data to display  Initial Impression / Assessment and Plan / UC Course  I have reviewed the triage vital signs and the nursing notes.  Pertinent labs & imaging results that were available during my care of the patient were reviewed by me and considered in my medical decision making (see chart for details).    37 year old male presents with dizziness and ear discomfort.  Exam concerning for otitis.  Placing on Augmentin. HiNTs exam consistent with peripheral etiology. Meclizine as needed.   Final Clinical Impressions(s) / UC Diagnoses   Final diagnoses:  Vertigo  Left otitis media, unspecified otitis media type     Discharge Instructions     Meds as prescribed.  Take care  Dr. Adriana Simas    ED Prescriptions    Medication Sig Dispense Auth. Provider   amoxicillin-clavulanate (AUGMENTIN) 875-125 MG tablet Take 1 tablet by mouth every 12 (twelve) hours. 14 tablet Raymona Boss G, DO   meclizine (ANTIVERT) 25 MG tablet Take 1 tablet (25 mg total) by mouth 3 (three) times daily as needed for dizziness. 30 tablet Tommie Sams, DO     Controlled Substance Prescriptions [place] Controlled Substance Registry consulted? Not Applicable   Tommie Sams, DO 01/10/18 1555

## 2018-01-10 NOTE — Discharge Instructions (Signed)
Meds as prescribed. ° °Take care ° °Dr. Banyan Goodchild  °

## 2018-03-21 IMAGING — CR DG CHEST 2V
2 series · 2 of 2 positions shown · non-contrast
Comparison: April 25, 2010

CLINICAL DATA: Cough and wheezing.

EXAM:
CHEST  2 VIEW

[chest pa]
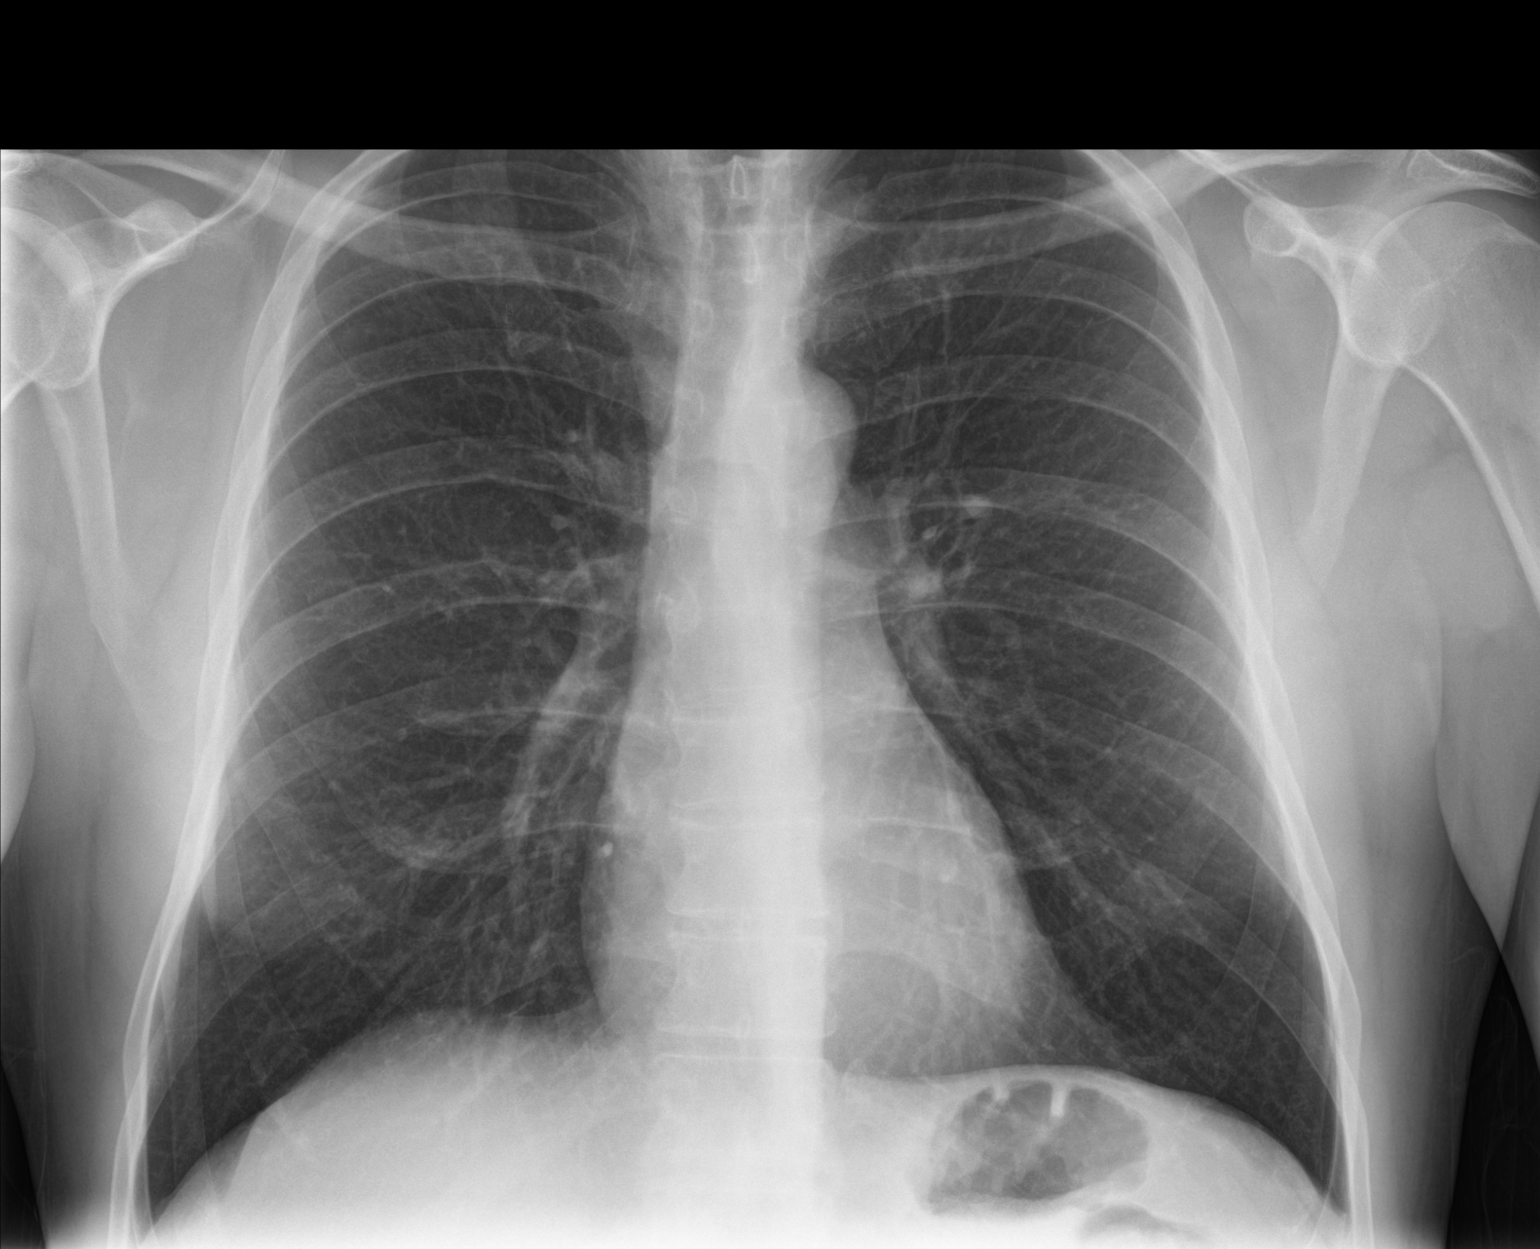

[chest lat]
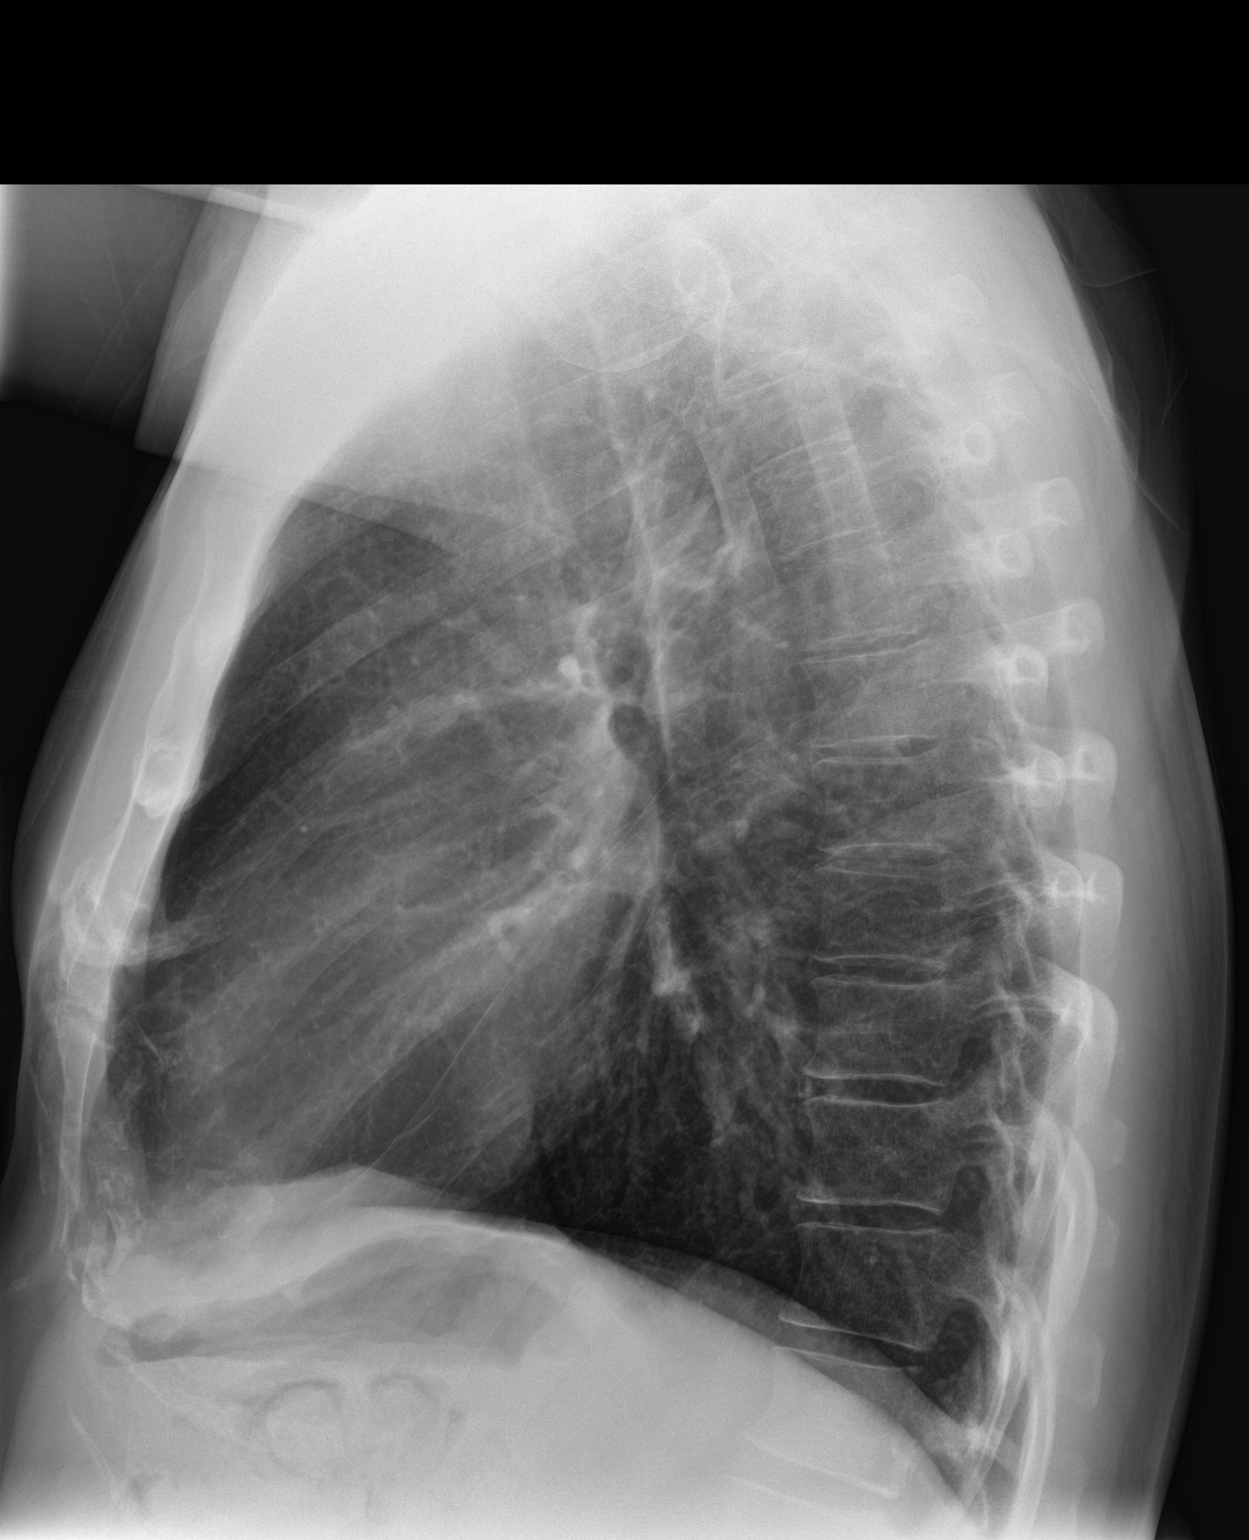

[2 of 2 positions shown; findings below may reference images not displayed]

FINDINGS: The heart size and mediastinal contours are within normal limits.
Both lungs are clear. There is no pneumothorax. The visualized
skeletal structures are unremarkable.
IMPRESSION: No active cardiopulmonary disease.  No pneumothorax is noted.

## 2018-12-09 ENCOUNTER — Ambulatory Visit
Admission: EM | Admit: 2018-12-09 | Discharge: 2018-12-09 | Disposition: A | Discharge status: Home / Self Care | Payer: Self-pay | Attending: Emergency Medicine | Admitting: Emergency Medicine

## 2018-12-09 ENCOUNTER — Other Ambulatory Visit: Payer: Self-pay

## 2018-12-09 DIAGNOSIS — Z760 Encounter for issue of repeat prescription: Secondary | ICD-10-CM

## 2018-12-09 MED ORDER — EPINEPHRINE 0.3 MG/0.3ML IJ SOAJ: 0.3000 mg | INTRAMUSCULAR | 0 refills | Status: DC | PRN

## 2018-12-09 NOTE — ED Triage Notes (Signed)
Patient states that he is here for a script for Epi- Pen. Patient states that has one on hand in case he gets stung by a bee. States that he lost his.

## 2018-12-09 NOTE — ED Provider Notes (Addendum)
[facility]    CSN: [id number] Arrival date & time: 12/09/18  1034      History   Chief Complaint Chief Complaint  Patient presents with  . Medication Refill    HPI Jesus Nash is a 38 y.o. male requesting a refill of Epipen. Pt states that he had it on his person on Friday at work, but did not have it when he returned home. He found another pen in his home, but he noted that the medication was discolored. 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   History reviewed. No pertinent past medical history.  There are no active problems to display for this patient.   Past Surgical History:  Procedure Laterality Date  . APPENDECTOMY    . TONSILLECTOMY         Home Medications    Prior to Admission medications   Medication Sig Start Date End Date Taking? Authorizing Provider  EPINEPHrine (EPIPEN 2-PAK) 0.3 mg/0.3 mL IJ SOAJ injection Inject 0.3 mLs (0.3 mg total) into the muscle once. 11/25/14  Yes Emily FilbertWilliams, Jonathan E, MD  albuterol (PROVENTIL HFA;VENTOLIN HFA) 108 (90 Base) MCG/ACT inhaler Inhale 1-2 puffs into the lungs every 6 (six) hours as needed for wheezing or shortness of breath. 03/20/17   Domenick GongMortenson, Ashley, MD  amoxicillin-clavulanate (AUGMENTIN) 875-125 MG tablet Take 1 tablet by mouth every 12 (twelve) hours. 01/10/18   Tommie Samsook, Jayce G, DO  EPINEPHrine 0.3 mg/0.3 mL IJ SOAJ injection Inject 0.3 mLs (0.3 mg total) into the muscle as needed for anaphylaxis. 12/09/18   Bailey MechBenjamin, Geddy [place], NP  ibuprofen (ADVIL,MOTRIN) 600 MG tablet Take 1 tablet (600 mg total) by mouth every 6 (six) hours as needed. 03/20/17   Domenick GongMortenson, Ashley, MD  ipratropium (ATROVENT) 0.06 % nasal spray Place 2 sprays into both nostrils 4 (four) times daily. 3-4 times/ day 03/20/17   Domenick GongMortenson, Ashley, MD  meclizine (ANTIVERT) 25 MG tablet Take 1 tablet (25 mg total) by mouth 3 (three) times daily as needed for dizziness. 01/10/18   Tommie Samsook, Jayce G, DO   Spacer/Aero-Holding Chambers (AEROCHAMBER PLUS) inhaler Use as instructed 03/20/17   Domenick GongMortenson, Ashley, MD    Family History Family History  Problem Relation Age of Onset  . Diabetes Mother     Social History Social History   Tobacco Use  . Smoking status: Current Every Day Smoker    Packs/day: 2.00    Types: Cigarettes  . Smokeless tobacco: Never Used  Substance Use Topics  . Alcohol use: Yes    Comment: occasionally  . Drug use: No     Allergies   Bee venom   Review of Systems Review of Systems  All other systems reviewed and are negative.    Physical Exam Triage Vital Signs ED Triage Vitals  Enc Vitals Group     BP 12/09/18 1051 132/83     Pulse Rate 12/09/18 1051 (!) 56     Resp 12/09/18 1051 16     Temp 12/09/18 1051 98.2 F (36.8 C)     Temp Source 12/09/18 1051 Oral     SpO2 12/09/18 1051 100 %     Weight 12/09/18 1049 190 lb (86.2 kg)     Height 12/09/18 1049 5\' 11"  (1.803 m)     Head Circumference --      Peak Flow --      Pain Score 12/09/18 1049 0     Pain Loc --  Pain Edu? --      Excl. in [place]? --    No data found.  Updated Vital Signs BP 132/83 (BP Location: Right Arm)   Pulse (!) 56   Temp 98.2 F (36.8 C) (Oral)   Resp 16   Ht 5\' 11"  (1.803 m)   Wt 190 lb (86.2 kg)   SpO2 100%   BMI 26.50 kg/m   Physical Exam Vitals signs reviewed.  Neurological:     Mental Status: He is alert.  Psychiatric:        Behavior: Behavior normal.        Thought Content: Thought content normal.        Judgment: Judgment normal.      UC Treatments / Results  Labs (all labs ordered are listed, but only abnormal results are displayed) Labs Reviewed - No data to display  EKG None  Radiology No results found.  Procedures Procedures (including critical care time)  Medications Ordered in UC Medications - No data to display  Initial Impression / Assessment and Plan / UC Course  I have reviewed the triage vital signs and the nursing  notes.  Pertinent labs & imaging results that were available during my care of the patient were reviewed by me and considered in my medical decision making (see chart for details).     Pt presents with a request for an Epipen refill. Not in acute distress at time of exam. Medication sent to pharmacy. Pt encouraged to find a PCP in area. All questions answered and all concerns addressed.   Final Clinical Impressions(s) / UC Diagnoses   Final diagnoses:  Encounter for medication refill   Discharge Instructions   None    ED Prescriptions    Medication Sig Dispense Auth. Provider   EPINEPHrine 0.3 mg/0.3 mL IJ SOAJ injection Inject 0.3 mLs (0.3 mg total) into the muscle as needed for anaphylaxis. [place], Tabbetha Kutscher, NP        Gertie Baron, NP 12/09/18 [place], Keelynn Furgerson, NP 12/09/18 1249

## 2019-04-30 ENCOUNTER — Other Ambulatory Visit: Payer: Self-pay | Admitting: *Deleted

## 2019-04-30 DIAGNOSIS — Z20822 Contact with and (suspected) exposure to covid-19: Secondary | ICD-10-CM

## 2019-05-01 LAB — NOVEL CORONAVIRUS, NAA: SARS-CoV-2, NAA: NOT DETECTED

## 2019-05-02 ENCOUNTER — Telehealth: Payer: Self-pay | Admitting: General Practice

## 2019-05-02 NOTE — Telephone Encounter (Signed)
Negative COVID results given. Patient results "NOT Detected." Caller expressed understanding. ° °

## 2019-07-18 DIAGNOSIS — R69 Illness, unspecified: Secondary | ICD-10-CM | POA: Diagnosis not present

## 2019-07-18 DIAGNOSIS — Z1159 Encounter for screening for other viral diseases: Secondary | ICD-10-CM | POA: Diagnosis not present

## 2019-07-18 DIAGNOSIS — Z23 Encounter for immunization: Secondary | ICD-10-CM | POA: Diagnosis not present

## 2019-07-18 DIAGNOSIS — G4730 Sleep apnea, unspecified: Secondary | ICD-10-CM | POA: Diagnosis not present

## 2019-07-18 DIAGNOSIS — N3944 Nocturnal enuresis: Secondary | ICD-10-CM | POA: Diagnosis not present

## 2019-07-18 DIAGNOSIS — Z13220 Encounter for screening for lipoid disorders: Secondary | ICD-10-CM | POA: Diagnosis not present

## 2019-07-18 DIAGNOSIS — Z114 Encounter for screening for human immunodeficiency virus [HIV]: Secondary | ICD-10-CM | POA: Diagnosis not present

## 2019-12-08 ENCOUNTER — Other Ambulatory Visit: Payer: Self-pay | Admitting: *Deleted

## 2019-12-08 DIAGNOSIS — N3944 Nocturnal enuresis: Secondary | ICD-10-CM

## 2019-12-09 ENCOUNTER — Other Ambulatory Visit: Payer: Self-pay

## 2019-12-09 ENCOUNTER — Ambulatory Visit: Payer: BC Managed Care – PPO | Admitting: Urology

## 2019-12-09 ENCOUNTER — Encounter: Payer: Self-pay | Admitting: Urology

## 2019-12-09 ENCOUNTER — Other Ambulatory Visit
Admission: RE | Admit: 2019-12-09 | Discharge: 2019-12-09 | Disposition: A | Discharge status: Home / Self Care | Payer: BC Managed Care – PPO | Attending: Urology | Admitting: Urology

## 2019-12-09 VITALS — BP 115/71 | HR 56 | Ht 71.0 in | Wt 179.0 lb

## 2019-12-09 DIAGNOSIS — N3944 Nocturnal enuresis: Secondary | ICD-10-CM | POA: Insufficient documentation

## 2019-12-09 DIAGNOSIS — N3281 Overactive bladder: Secondary | ICD-10-CM | POA: Diagnosis not present

## 2019-12-09 DIAGNOSIS — N529 Male erectile dysfunction, unspecified: Secondary | ICD-10-CM

## 2019-12-09 LAB — URINALYSIS, COMPLETE (UACMP) WITH MICROSCOPIC
Bacteria, UA: NONE SEEN
Bilirubin Urine: NEGATIVE
Glucose, UA: NEGATIVE mg/dL
Hgb urine dipstick: NEGATIVE
Ketones, ur: NEGATIVE mg/dL
Leukocytes,Ua: NEGATIVE
Nitrite: NEGATIVE
Protein, ur: NEGATIVE mg/dL
RBC / HPF: NONE SEEN RBC/hpf (ref 0–5)
Specific Gravity, Urine: 1.02 (ref 1.005–1.030)
Squamous Epithelial / LPF: NONE SEEN (ref 0–5)
pH: 7.5 (ref 5.0–8.0)

## 2019-12-09 LAB — BLADDER SCAN AMB NON-IMAGING: Scan Result: 0

## 2019-12-09 MED ORDER — TADALAFIL 5 MG PO TABS: 5.0000 mg | ORAL_TABLET | Freq: Every day | ORAL | 6 refills | Status: AC | PRN

## 2019-12-09 NOTE — Progress Notes (Signed)
12/09/19 10:02 AM   Jesus Nash [birth year] [id number]  CC: Urinary frequency, nocturnal enuresis, erectile dysfunction  HPI: I saw Mr. Jesus Nash in urology clinic today for the above issues.  He is a healthy 39 year old male with anxiety on buspirone who presents with urinary frequency during the day, occasional nocturnal enuresis 1-2 times per month, and erectile dysfunction.  He drinks 4 cups of coffee during the day and tea in the afternoon and evening.  He denies any history of urinary tract infections.  He has bothersome urinary frequency during the day and nocturia 1-2 times per night.  He denies any weak stream or feeling of incomplete emptying.  He denies any gross hematuria.  He denies any incontinence during the day.  He does not take any medications for sleep.  He has been evaluated for sleep apnea and that was negative.  He had nocturnal enuresis until around age 60 as a child.  No prior history of UTIs or retention.  In terms of his erections, he reports he has difficulty getting and maintaining an erection, he is never tried any medications for this.  He feels this may be related to his anxiety.  He recently quit smoking, but has started vaping.  Urinalysis is completely benign today and PVR 0 mL.   PMH: Anxiety   Surgical History: Past Surgical History:  Procedure Laterality Date  . APPENDECTOMY    . TONSILLECTOMY      Family History: Family History  Problem Relation Age of Onset  . Diabetes Mother     Social History:  reports that he has been smoking cigarettes. He has been smoking about 2.00 packs per day. He has never used smokeless tobacco. He reports current alcohol use. He reports that he does not use drugs.  Physical Exam: BP 115/71   Pulse (!) 56   Ht 5\' 11"  (1.803 m)   Wt 179 lb (81.2 kg)   BMI 24.97 kg/m    Constitutional:  Alert and oriented, No acute distress. Cardiovascular: No clubbing, cyanosis, or edema. Respiratory: Normal respiratory effort, no  increased work of breathing. GI: Abdomen is soft, nontender, nondistended, no abdominal masses GU: Circumcised phallus with widely patent meatus, testicles 20 cc and descended bilaterally without masses.  Laboratory Data: Reviewed, see HPI  Pertinent Imaging: None to review  Assessment & Plan:   In summary, is a healthy 39 year old male with bothersome urinary urgency and frequency during the day, nocturia 1-2 times per night, nocturnal enuresis 1-2 times per month, and erectile dysfunction.  We had a very long conversation today about these issues and treatment options.  Regarding his urinary symptoms, I suspect most of these are related to his fluid intake during the day.  I recommended decreasing his coffee and tea intake, minimizing fluids after 6 PM, and double voiding prior to bed.  Regarding the nocturnal enuresis, I recommended considering a trial of a bedwetting alarm(can be purchased off [employer]) if he is still having nocturnal enuresis despite the above strategies.  Finally, regarding his erections, I recommended a trial of Cialis 5 mg daily, and risks and benefits were discussed at length.  We discussed that this sometimes can help with urinary symptoms as well.  -Trial of Cialis 5 mg as needed -Behavioral strategies discussed at length regarding urination -RTC 2 months for symptom check, consider anticholinergic if persistently bothersome symptoms despite cutting out bladder irritants  44, MD 12/09/2019  [facility] [address], [address] [place], [place]  [address] [phone]   

## 2019-12-09 NOTE — Patient Instructions (Addendum)
1.  Minimize fluids after 7 PM, urinate at least once right before bed 2.  Cut back on coffee, tea, and sodas in the diet, as these increase her urinary frequency, and urination overnight 3.  If these strategies are not working, purchase a bedwetting bed alarm from [employer].  This will wake you up if you are urinating while asleep, and re-train the brain to hold urine overnight 4.  The Cialis for erections can be taken as needed at least 2 hours before sexual activity, or every other day  Tadalafil tablets (Cialis) What is this medicine? TADALAFIL (tah DA la fil) is used to treat erection problems in men. It is also used for enlargement of the prostate gland in men, a condition called benign prostatic hyperplasia or BPH. This medicine improves urine flow and reduces BPH symptoms. This medicine can also treat both erection problems and BPH when they occur together. This medicine may be used for other purposes; ask your health care provider or pharmacist if you have questions. COMMON BRAND NAME(S): Mady Gemma, Cialis What should I tell my health care provider before I take this medicine? They need to know if you have any of these conditions:  bleeding disorders  eye or vision problems, including a rare inherited eye disease called retinitis pigmentosa  anatomical deformation of the penis, Peyronie's disease, or history of priapism (painful and prolonged erection)  heart disease, angina, a history of heart attack, irregular heart beats, or other heart problems  high or low blood pressure  history of blood diseases, like sickle cell anemia or leukemia  history of stomach bleeding  kidney disease  liver disease  stroke  an unusual or allergic reaction to tadalafil, other medicines, foods, dyes, or preservatives  pregnant or trying to get pregnant  breast-feeding How should I use this medicine? Take this medicine by mouth with a glass of water. Follow the directions on the  prescription label. You may take this medicine with or without meals. When this medicine is used for erection problems, your doctor may prescribe it to be taken once daily or as needed. If you are taking the medicine as needed, you may be able to have sexual activity 30 minutes after taking it and for up to 36 hours after taking it. Whether you are taking the medicine as needed or once daily, you should not take more than one dose per day. If you are taking this medicine for symptoms of benign prostatic hyperplasia (BPH) or to treat both BPH and an erection problem, take the dose once daily at about the same time each day. Do not take your medicine more often than directed. Talk to your pediatrician regarding the use of this medicine in children. Special care may be needed. Overdosage: If you think you have taken too much of this medicine contact a poison control center or emergency room at once. NOTE: This medicine is only for you. Do not share this medicine with others. What if I miss a dose? If you are taking this medicine as needed for erection problems, this does not apply. If you miss a dose while taking this medicine once daily for an erection problem, benign prostatic hyperplasia, or both, take it as soon as you remember, but do not take more than one dose per day. What may interact with this medicine? Do not take this medicine with any of the following medications:  nitrates like amyl nitrite, isosorbide dinitrate, isosorbide mononitrate, nitroglycerin  other medicines for erectile dysfunction like  avanafil, sildenafil, vardenafil  other tadalafil products ([employer])  riociguat This medicine may also interact with the following medications:  certain drugs for high blood pressure  certain drugs for the treatment of HIV infection or AIDS  certain drugs used for fungal or yeast infections, like fluconazole, itraconazole, ketoconazole, and voriconazole  certain drugs used for seizures  like carbamazepine, phenytoin, and phenobarbital  grapefruit juice  macrolide antibiotics like clarithromycin, erythromycin, troleandomycin  medicines for prostate problems  rifabutin, rifampin or rifapentine This list may not describe all possible interactions. Give your health care provider a list of all the medicines, herbs, non-prescription drugs, or dietary supplements you use. Also tell them if you smoke, drink alcohol, or use illegal drugs. Some items may interact with your medicine. What should I watch for while using this medicine? If you notice any changes in your vision while taking this drug, call your doctor or health care professional as soon as possible. Stop using this medicine and call your health care provider right away if you have a loss of sight in one or both eyes. Contact your doctor or health care professional right away if the erection lasts longer than 4 hours or if it becomes painful. This may be a sign of serious problem and must be treated right away to prevent permanent damage. If you experience symptoms of nausea, dizziness, chest pain or arm pain upon initiation of sexual activity after taking this medicine, you should refrain from further activity and call your doctor or health care professional as soon as possible. Do not drink alcohol to excess (examples, 5 glasses of wine or 5 shots of whiskey) when taking this medicine. When taken in excess, alcohol can increase your chances of getting a headache or getting dizzy, increasing your heart rate or lowering your blood pressure. Using this medicine does not protect you or your partner against HIV infection (the virus that causes AIDS) or other sexually transmitted diseases. What side effects may I notice from receiving this medicine? Side effects that you should report to your doctor or health care professional as soon as possible:  allergic reactions like skin rash, itching or hives, swelling of the face, lips, or  tongue  breathing problems  changes in hearing  changes in vision  chest pain  fast, irregular heartbeat  prolonged or painful erection  seizures Side effects that usually do not require medical attention (report to your doctor or health care professional if they continue or are bothersome):  back pain  dizziness  flushing  headache  indigestion  muscle aches  nausea  stuffy or runny nose This list may not describe all possible side effects. Call your doctor for medical advice about side effects. You may report side effects to FDA at 1-800-FDA-1088. Where should I keep my medicine? Keep out of the reach of children. Store at room temperature between 15 and 30 degrees C (59 and 86 degrees F). Throw away any unused medicine after the expiration date. NOTE: This sheet is a summary. It may not cover all possible information. If you have questions about this medicine, talk to your doctor, pharmacist, or health care provider.  2020 Elsevier/Gold Standard (2013-10-31 13:15:49)   Overactive Bladder, Adult  Overactive bladder refers to a condition in which a person has a sudden need to pass urine. The person may leak urine if he or she cannot get to the bathroom fast enough (urinary incontinence). A person with this condition may also wake up several times in the night  to go to the bathroom. Overactive bladder is associated with poor nerve signals between your bladder and your brain. Your bladder may get the signal to empty before it is full. You may also have very sensitive muscles that make your bladder squeeze too soon. These symptoms might interfere with daily work or social activities. What are the causes? This condition may be associated with or caused by:  Urinary tract infection.  Infection of nearby tissues, such as the prostate.  Prostate enlargement.  Surgery on the uterus or urethra.  Bladder stones, inflammation, or tumors.  Drinking too much caffeine or  alcohol.  Certain medicines, especially medicines that get rid of extra fluid in the body (diuretics).  Muscle or nerve weakness, especially from: ? A spinal cord injury. ? Stroke. ? Multiple sclerosis. ? Parkinson's disease.  Diabetes.  Constipation. What increases the risk? You may be at greater risk for overactive bladder if you:  Are an older adult.  Smoke.  Are going through menopause.  Have prostate problems.  Have a neurological disease, such as stroke, dementia, Parkinson's disease, or multiple sclerosis (MS).  Eat or drink things that irritate the bladder. These include alcohol, spicy food, and caffeine.  Are overweight or obese. What are the signs or symptoms? Symptoms of this condition include:  Sudden, strong urge to urinate.  Leaking urine.  Urinating 8 or more times a day.  Waking up to urinate 2 or more times a night. How is this diagnosed? Your health care provider may suspect overactive bladder based on your symptoms. He or she will diagnose this condition by:  A physical exam and medical history.  Blood or urine tests. You might need bladder or urine tests to help determine what is causing your overactive bladder. You might also need to see a health care provider who specializes in urinary tract problems (urologist). How is this treated? Treatment for overactive bladder depends on the cause of your condition and whether it is mild or severe. You can also make lifestyle changes at home. Options include:  Bladder training. This may include: ? Learning to control the urge to urinate by following a schedule that directs you to urinate at regular intervals (timed voiding). ? Doing Kegel exercises to strengthen your pelvic floor muscles, which support your bladder. Toning these muscles can help you control urination, even if your bladder muscles are overactive.  Special devices. This may include: ? Biofeedback, which uses sensors to help you become  aware of your body's signals. ? Electrical stimulation, which uses electrodes placed inside the body (implanted) or outside the body. These electrodes send gentle pulses of electricity to strengthen the nerves or muscles that control the bladder. ? Women may use a plastic device that fits into the vagina and supports the bladder (pessary).  Medicines. ? Antibiotics to treat bladder infection. ? Antispasmodics to stop the bladder from releasing urine at the wrong time. ? Tricyclic antidepressants to relax bladder muscles. ? Injections of botulinum toxin type A directly into the bladder tissue to relax bladder muscles.  Lifestyle changes. This may include: ? Weight loss. Talk to your health care provider about weight loss methods that would work best for you. ? Diet changes. This may include reducing how much alcohol and caffeine you consume, or drinking fluids at different times of the day. ? Not smoking. Do not use any products that contain nicotine or tobacco, such as cigarettes and e-cigarettes. If you need help quitting, ask your health care provider.  Surgery. ?  A device may be implanted to help manage the nerve signals that control urination. ? An electrode may be implanted to stimulate electrical signals in the bladder. ? A procedure may be done to change the shape of the bladder. This is done only in very severe cases. Follow these instructions at home: Lifestyle  Make any diet or lifestyle changes that are recommended by your health care provider. These may include: ? Drinking less fluid or drinking fluids at different times of the day. ? Cutting down on caffeine or alcohol. ? Doing Kegel exercises. ? Losing weight if needed. ? Eating a healthy and balanced diet to prevent constipation. This may include:  Eating foods that are high in fiber, such as fresh fruits and vegetables, whole grains, and beans.  Limiting foods that are high in fat and processed sugars, such as fried and  sweet foods. General instructions  Take over-the-counter and prescription medicines only as told by your health care provider.  If you were prescribed an antibiotic medicine, take it as told by your health care provider. Do not stop taking the antibiotic even if you start to feel better.  Use any implants or pessary as told by your health care provider.  If needed, wear pads to absorb urine leakage.  Keep a journal or log to track how much and when you drink and when you feel the need to urinate. This will help your health care provider monitor your condition.  Keep all follow-up visits as told by your health care provider. This is important. Contact a health care provider if:  You have a fever.  Your symptoms do not get better with treatment.  Your pain and discomfort get worse.  You have more frequent urges to urinate. Get help right away if:  You are not able to control your bladder. Summary  Overactive bladder refers to a condition in which a person has a sudden need to pass urine.  Several conditions may lead to an overactive bladder.  Treatment for overactive bladder depends on the cause and severity of your condition.  Follow your health care provider's instructions about lifestyle changes, doing Kegel exercises, keeping a journal, and taking medicines. This information is not intended to replace advice given to you by your health care provider. Make sure you discuss any questions you have with your health care provider. Document Revised: 10/03/2018 Document Reviewed: 06/28/2017 Elsevier Patient Education  [place].

## 2020-02-10 ENCOUNTER — Encounter: Payer: Self-pay | Admitting: Urology

## 2020-02-10 ENCOUNTER — Other Ambulatory Visit: Payer: Self-pay

## 2020-02-10 ENCOUNTER — Ambulatory Visit (INDEPENDENT_AMBULATORY_CARE_PROVIDER_SITE_OTHER): Payer: BC Managed Care – PPO | Admitting: Urology

## 2020-02-10 VITALS — BP 124/73 | HR 67 | Ht 71.0 in | Wt 181.0 lb

## 2020-02-10 DIAGNOSIS — N3944 Nocturnal enuresis: Secondary | ICD-10-CM | POA: Diagnosis not present

## 2020-02-10 DIAGNOSIS — N3281 Overactive bladder: Secondary | ICD-10-CM | POA: Diagnosis not present

## 2020-02-10 DIAGNOSIS — N529 Male erectile dysfunction, unspecified: Secondary | ICD-10-CM

## 2020-02-10 LAB — BLADDER SCAN AMB NON-IMAGING: Scan Result: 48

## 2020-02-10 NOTE — Progress Notes (Signed)
° °  02/10/2020 9:25 AM   Jesus Nash 1980-12-06 [id number]  Reason for visit: Follow up urinary symptoms, ED  HPI: I saw Mr. Follow back in urology clinic for the above issues. Briefly, he is a healthy 39 year old male with past medical history notable for anxiety who I originally saw in June 2021 for urinary urgency and frequency during the day, nocturia 1-2 times overnight, nocturnal enuresis 1-2 times per month, and erectile dysfunction. He was consuming a large volume of caffeine including coffee and tea throughout the day, and we focused primarily on behavioral strategies. We also tried Cialis 5 mg daily for his ED.  He reports significant improvement in his urinary symptoms from prior with the behavioral strategies, as well as the Cialis. IPSS score today is 8, with quality of life mixed, and PVR normal at 45 mL. He has not had any episodes of nocturnal enuresis. He also reports his erections have improved significantly on the Cialis and is currently very satisfied. He denies any side effects.  Continue Cialis 5 mg daily or every other day Behavioral strategies reviewed again Consider bed alarm monitor if trouble with bedwetting in the future RTC 1 year symptom check   Sondra Come, MD  [facility] [address] [place], [place] [address] [phone]

## 2020-04-14 ENCOUNTER — Encounter: Payer: Self-pay | Admitting: Emergency Medicine

## 2020-04-14 ENCOUNTER — Ambulatory Visit
Admission: EM | Admit: 2020-04-14 | Discharge: 2020-04-14 | Disposition: A | Discharge status: Home / Self Care | Payer: Self-pay | Attending: Family Medicine | Admitting: Family Medicine

## 2020-04-14 ENCOUNTER — Other Ambulatory Visit: Payer: Self-pay

## 2020-04-14 DIAGNOSIS — M545 Low back pain, unspecified: Secondary | ICD-10-CM

## 2020-04-14 HISTORY — DX: Anxiety disorder, unspecified: F41.9

## 2020-04-14 MED ORDER — TIZANIDINE HCL 4 MG PO TABS: 4.0000 mg | ORAL_TABLET | Freq: Three times a day (TID) | ORAL | 0 refills | Status: AC | PRN

## 2020-04-14 MED ORDER — MELOXICAM 15 MG PO TABS: 15.0000 mg | ORAL_TABLET | Freq: Every day | ORAL | 0 refills | Status: AC | PRN

## 2020-04-14 NOTE — ED Provider Notes (Signed)
[facility]    CSN: [id number] Arrival date & time: 04/14/20  1352  History   Chief Complaint Chief Complaint  Patient presents with  . Back Pain   HPI   39 year old male presents with acute on chronic low back pain.  Patient states that this bothers him intermittently.  Worsened yesterday after twisting while under the crawlspace house.  Worsened again today after an additional twisting injury underneath the crawlspace.  He has taken ibuprofen with some improvement but no resolution.  No radicular symptoms at this time.  Pain is severe.  No other associated symptoms.  No other complaints.  Past Medical History:  Diagnosis Date  . Anxiety    Past Surgical History:  Procedure Laterality Date  . APPENDECTOMY    . FRACTURE SURGERY Left    ankle  . TONSILLECTOMY     Home Medications    Prior to Admission medications   Medication Sig Start Date End Date Taking? Authorizing Provider  busPIRone (BUSPAR) 15 MG tablet Take 15 mg by mouth 2 (two) times daily. 11/26/19  Yes [provider]  EPINEPHrine 0.3 mg/0.3 mL IJ SOAJ injection Inject 0.3 mLs into the muscle as needed. 10/11/19  Yes [provider]  fluticasone (FLONASE) 50 MCG/ACT nasal spray Place 2 sprays into the nose daily. 11/26/19 11/25/20 Yes [provider]  tadalafil (CIALIS) 5 MG tablet Take 1 tablet (5 mg total) by mouth daily as needed for erectile dysfunction. 12/09/19  Yes Sondra Come, MD  meloxicam (MOBIC) 15 MG tablet Take 1 tablet (15 mg total) by mouth daily as needed for pain. 04/14/20   Tommie Sams, DO  tiZANidine (ZANAFLEX) 4 MG tablet Take 1 tablet (4 mg total) by mouth every 8 (eight) hours as needed for muscle spasms. 04/14/20   Tommie Sams, DO  albuterol (PROVENTIL HFA;VENTOLIN HFA) 108 (90 Base) MCG/ACT inhaler Inhale 1-2 puffs into the lungs every 6 (six) hours as needed for wheezing or shortness of breath. 03/20/17 04/14/20  Domenick Gong, MD  ipratropium  (ATROVENT) 0.06 % nasal spray Place 2 sprays into both nostrils 4 (four) times daily. 3-4 times/ day 03/20/17 04/14/20  Domenick Gong, MD   Family History Family History  Problem Relation Age of Onset  . Diabetes Mother   . Congestive Heart Failure Father    Social History Social History   Tobacco Use  . Smoking status: Former Smoker    Packs/day: 2.00    Types: Cigarettes    Quit date: 07/16/2019    Years since quitting: 0.7  . Smokeless tobacco: Never Used  Vaping Use  . Vaping Use: Every day  . Substances: Nicotine, Flavoring  Substance Use Topics  . Alcohol use: Yes    Comment: occasionally  . Drug use: No   Allergies   Bee venom  Review of Systems Review of Systems  Musculoskeletal: Positive for back pain.   Physical Exam Triage Vital Signs ED Triage Vitals  Enc Vitals Group     BP 04/14/20 1419 115/75     Pulse Rate 04/14/20 1419 65     Resp 04/14/20 1419 18     Temp 04/14/20 1419 98.2 F (36.8 C)     Temp Source 04/14/20 1419 Oral     SpO2 04/14/20 1419 100 %     Weight 04/14/20 1419 180 lb (81.6 kg)     Height 04/14/20 1419 5\' 11"  (1.803 m)     Head Circumference --      Peak  Flow --      Pain Score 04/14/20 1418 10     Pain Loc --      Pain Edu? --      Excl. in GC? --    Updated Vital Signs BP 115/75 (BP Location: Left Arm)   Pulse 65   Temp 98.2 F (36.8 C) (Oral)   Resp 18   Ht 5\' 11"  (1.803 m)   Wt 81.6 kg   SpO2 100%   BMI 25.10 kg/m   Visual Acuity Right Eye Distance:   Left Eye Distance:   Bilateral Distance:    Right Eye Near:   Left Eye Near:    Bilateral Near:     Physical Exam Vitals and nursing note reviewed.  Constitutional:      General: He is not in acute distress.    Appearance: Normal appearance. He is not ill-appearing.  HENT:     Head: Normocephalic and atraumatic.  Eyes:     General:        Right eye: No discharge.        Left eye: No discharge.     Conjunctiva/sclera: Conjunctivae normal.    Cardiovascular:     Rate and Rhythm: Normal rate and regular rhythm.  Pulmonary:     Effort: Pulmonary effort is normal.     Breath sounds: Normal breath sounds. No wheezing, rhonchi or rales.  Musculoskeletal:     Comments: Right and left paraspinal musculature tenderness of the lumbar spine  Neurological:     Mental Status: He is alert.  Psychiatric:        Mood and Affect: Mood normal.        Behavior: Behavior normal.    UC Treatments / Results  Labs (all labs ordered are listed, but only abnormal results are displayed) Labs Reviewed - No data to display  EKG   Radiology No results found.  Procedures Procedures (including critical care time)  Medications Ordered in UC Medications - No data to display  Initial Impression / Assessment and Plan / UC Course  I have reviewed the triage vital signs and the nursing notes.  Pertinent labs & imaging results that were available during my care of the patient were reviewed by me and considered in my medical decision making (see chart for details).    39 year old male presents with back pain.  Strain and spasm.  Treating with Zanaflex and meloxicam.  Supportive care.  Final Clinical Impressions(s) / UC Diagnoses   Final diagnoses:  Acute bilateral low back pain without sciatica     Discharge Instructions     Medication as prescribed.  Rest. Heat.  Take care  Dr. 46    ED Prescriptions    Medication Sig Dispense Auth. Provider   tiZANidine (ZANAFLEX) 4 MG tablet Take 1 tablet (4 mg total) by mouth every 8 (eight) hours as needed for muscle spasms. 30 tablet Rasool Rommel G, DO   meloxicam (MOBIC) 15 MG tablet Take 1 tablet (15 mg total) by mouth daily as needed for pain. 30 tablet 08-25-1983, DO     PDMP not reviewed this encounter.   Tommie Sams, Tommie Sams 04/14/20 1502

## 2020-04-14 NOTE — Discharge Instructions (Signed)
Medication as prescribed.  Rest. Heat.  Take care  Dr. Adriana Simas

## 2020-04-14 NOTE — ED Triage Notes (Signed)
Patient in today c/o lower back pain since yesterday. Patient states he felt that he twisted his back yesterday. Patient has taken OTC Ibuprofen.

## 2021-02-08 ENCOUNTER — Ambulatory Visit: Payer: Self-pay | Admitting: Urology
